# Patient Record
Sex: Female | Born: 1955 | Race: White | Hispanic: No | Marital: Married
Health system: Southern US, Community
[De-identification: ages and names within clinical notes are randomized; demographics above are authoritative.]

---

## 2006-03-16 ENCOUNTER — Ambulatory Visit: Payer: Self-pay | Admitting: Urology

## 2007-12-16 ENCOUNTER — Emergency Department: Payer: Self-pay | Admitting: Emergency Medicine

## 2008-04-27 ENCOUNTER — Emergency Department: Payer: Self-pay | Admitting: Emergency Medicine

## 2013-02-18 ENCOUNTER — Ambulatory Visit: Payer: Self-pay | Admitting: Family Medicine

## 2015-12-17 ENCOUNTER — Other Ambulatory Visit: Payer: Self-pay | Admitting: Neurology

## 2015-12-17 DIAGNOSIS — R1310 Dysphagia, unspecified: Secondary | ICD-10-CM

## 2015-12-20 ENCOUNTER — Ambulatory Visit
Admission: RE | Admit: 2015-12-20 | Discharge: 2015-12-20 | Disposition: A | Discharge status: Home / Self Care | Payer: BLUE CROSS/BLUE SHIELD | Source: Ambulatory Visit | Attending: Neurology | Admitting: Neurology

## 2015-12-20 DIAGNOSIS — R1310 Dysphagia, unspecified: Secondary | ICD-10-CM | POA: Diagnosis present

## 2015-12-20 DIAGNOSIS — K449 Diaphragmatic hernia without obstruction or gangrene: Secondary | ICD-10-CM | POA: Insufficient documentation

## 2015-12-20 DIAGNOSIS — K228 Other specified diseases of esophagus: Secondary | ICD-10-CM | POA: Insufficient documentation

## 2018-03-04 IMAGING — RF DG ESOPHAGUS
6 series · 12 of 12 positions shown · non-contrast
Comparison: None in PACs

CLINICAL DATA: Dysphagia with solids and liquids, history of
Huntington's disease.

EXAM:
ESOPHOGRAM / BARIUM SWALLOW / BARIUM TABLET STUDY
TECHNIQUE: Combined double contrast and single contrast examination performed
using effervescent crystals, thick barium liquid, and thin barium
liquid. The patient was observed with fluoroscopy swallowing a 13 mm
barium sulphate tablet.
FLUOROSCOPY TIME:  Fluoroscopy Time:  0 minutes, 48 seconds
Number of Acquired Images:  5+ 1 video sequence

[Series 1: fluoro_barium 2fps_bw · 0.18mm/px · 2 of 2 frames shown (1 of 6)]
[frame 1/2]
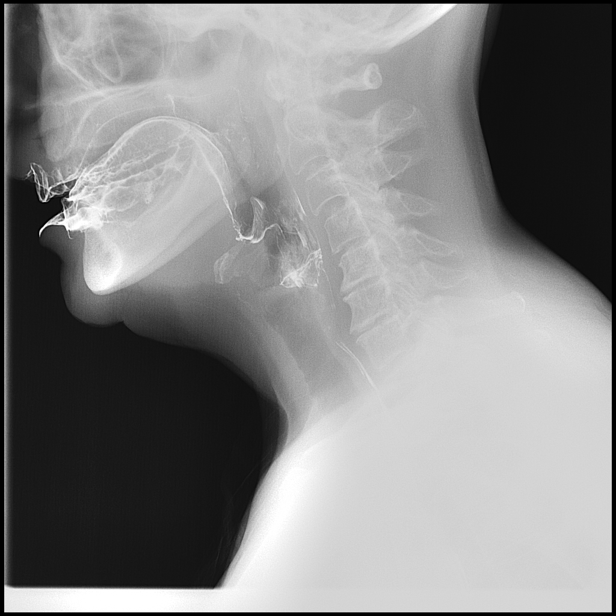
[frame 2/2]
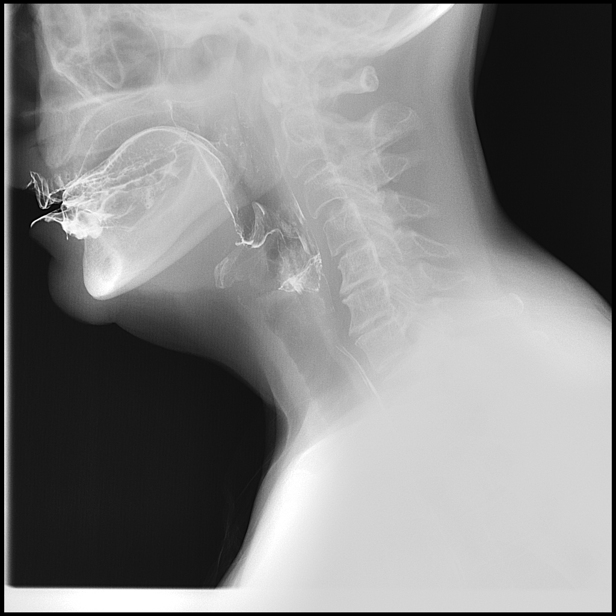

[Series 2: fluoro_barium 2fps_bw · 0.18mm/px · 2 of 2 frames shown (2 of 6)]
[frame 1/2]
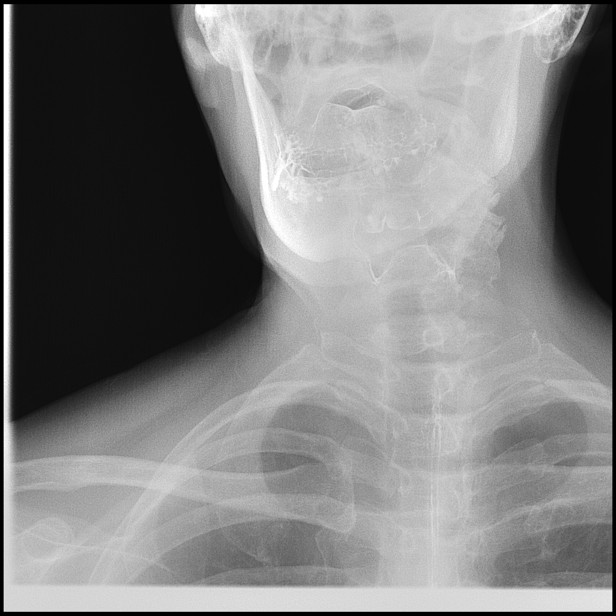
[frame 2/2]
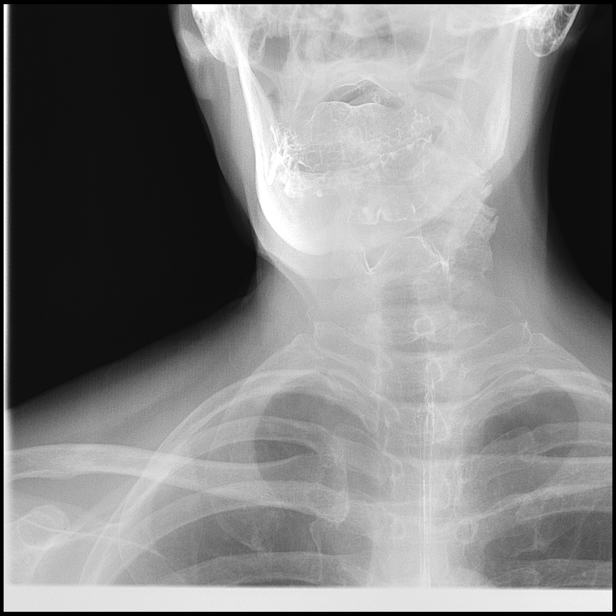

[Series 3: fluoro_barium 2fps_bw · 0.18mm/px · 1 of 1 slices shown (3 of 6)]
[im 1/1]
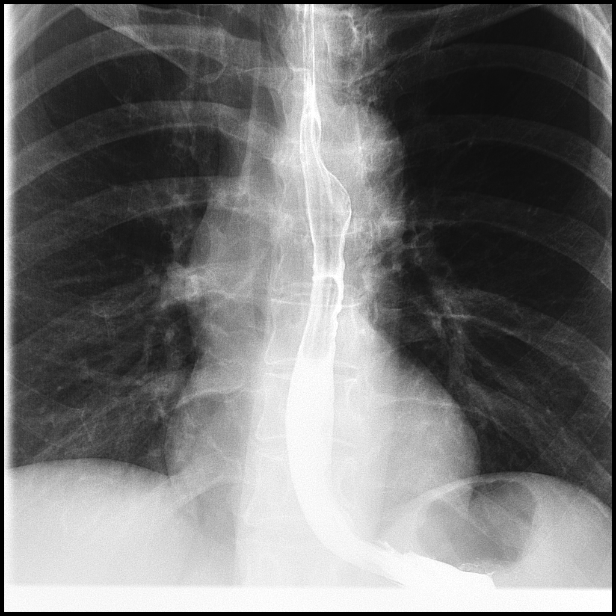

[Series 4: fluoro_barium 2fps_bw · 0.18mm/px · 1 of 1 slices shown (4 of 6)]
[im 1/1]
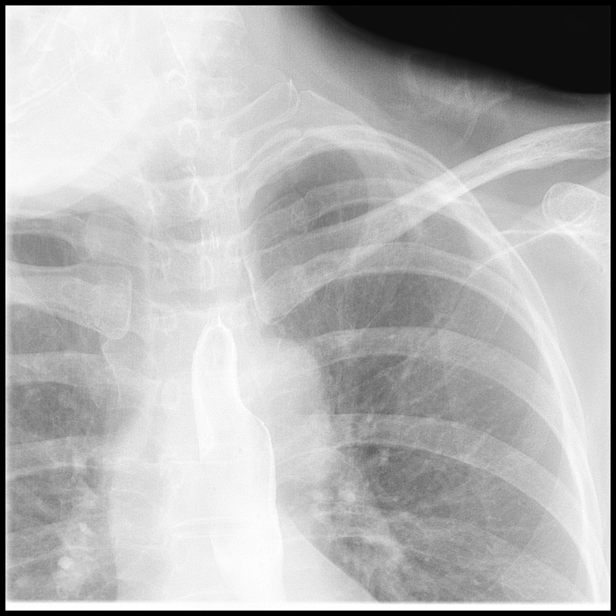

[Series 5: fluoro_barium 2fps_bw · 0.18mm/px · 4 of 18 frames shown (5 of 6)]
[frame 3/18]
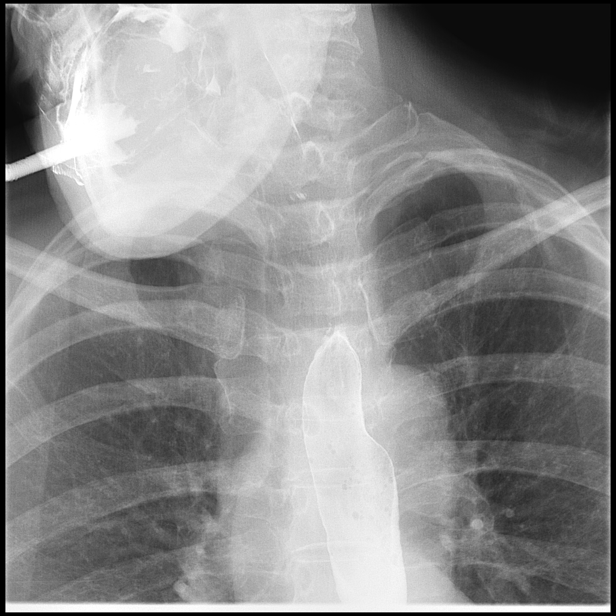
[frame 10/18]
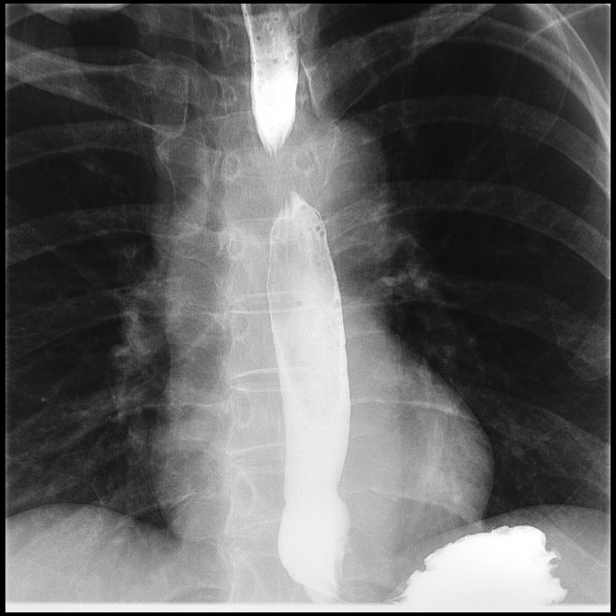
[frame 13/18]
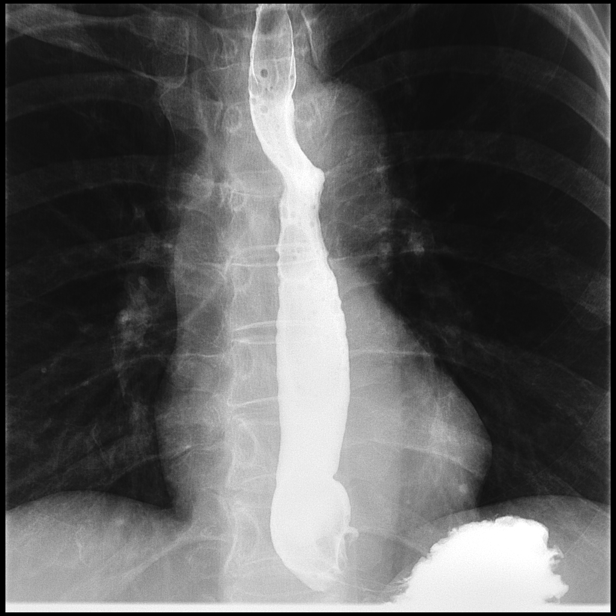
[frame 16/18]
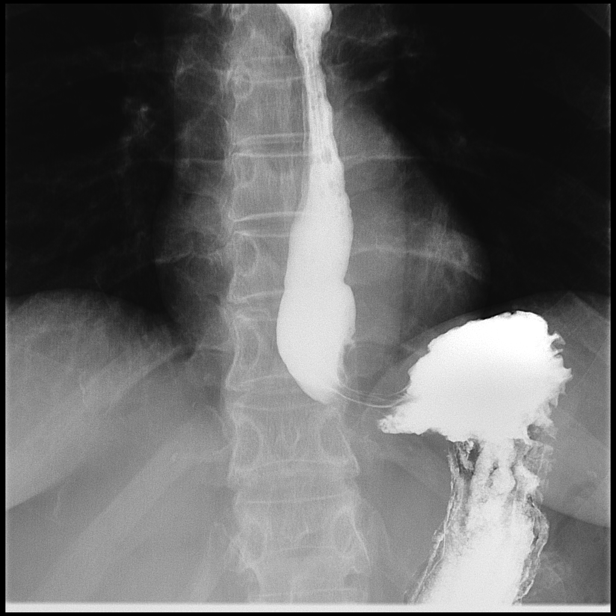

[Series 6: fluoro_barium 2fps_bw · 0.18mm/px · 2 of 2 frames shown (6 of 6)]
[frame 1/2]
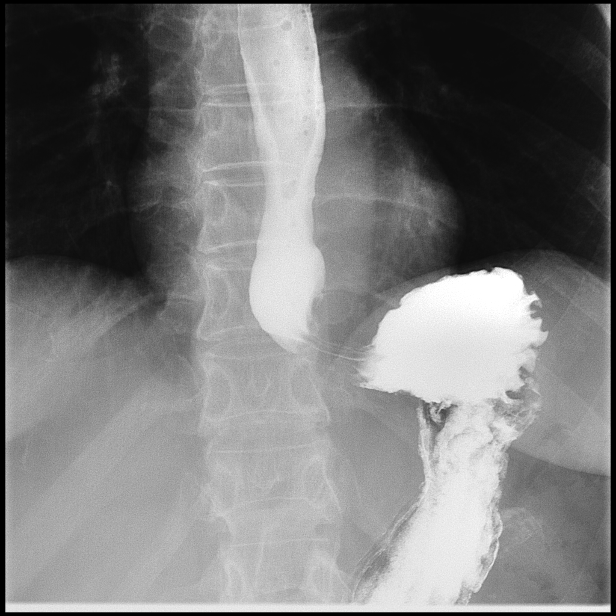
[frame 2/2]
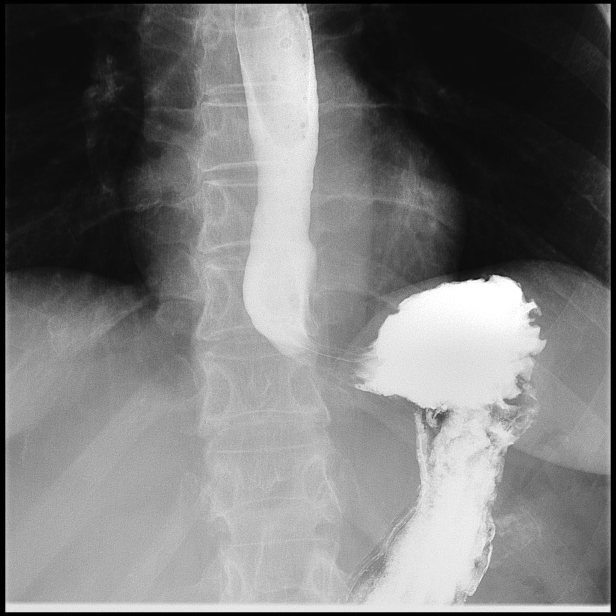

[12 of 12 positions shown; findings below may reference images not displayed]

FINDINGS: The patient ingested the thick and thin barium without difficulty.
There was mild delay in initiation of the primary swallowing
maneuver. However, once the barium was propelled into the
hypopharynx the voluntary portion of the swallowing maneuver was
normal. A few tertiary contractions were observed in the distal
esophagus. A small reducible hiatal hernia was observed. There was
no gastroesophageal reflux. No evidence of stricture or esophagitis
was observed. The barium tablet passed promptly from the mouth to
the stomach.
IMPRESSION: 1. Mild delay in initiation of the voluntary portion of the
swallowing maneuver. The autonomic portion of the swallowing
maneuver appeared normal.
2. Mild changes of presbyesophagus were observed transiently. A
small reducible hiatal hernia was observed without evidence of
reflux, stricture, or esophagitis.

## 2019-03-16 ENCOUNTER — Telehealth: Payer: Self-pay | Admitting: Primary Care

## 2019-03-16 NOTE — Telephone Encounter (Signed)
Called patient to schedule Palliative Consult and spoke with patient's sister Hoyle Sauer, after discussing what Palliative services were she was in agreement with this.  I have scheduled a Telephone Consult for 03/23/19 @ 11 AM

## 2019-03-23 ENCOUNTER — Other Ambulatory Visit: Payer: Self-pay

## 2019-03-23 ENCOUNTER — Other Ambulatory Visit: Payer: BLUE CROSS/BLUE SHIELD | Admitting: Primary Care

## 2019-03-23 DIAGNOSIS — Z515 Encounter for palliative care: Secondary | ICD-10-CM

## 2019-03-23 NOTE — Progress Notes (Signed)
[facility] Consult Note Telephone: [phone]  Fax: [phone]  PATIENT NAME: Rachael Hill [id number] [phone] (home)  DOB: 01-20-1956 MRN: [id number]  PRIMARY CARE PROVIDER: Gay[place] Hill, [place] / [place] [address]  REFERRING PROVIDER:  Anabel Bene, MD [facility],  [place] [address] [phone]  RESPONSIBLE PARTY:   Extended Emergency Contact Information Primary Emergency Contact: Hill,Rachael C Address: [address]          [place], [place] [address] [place] Phone: [phone] Relation: Spouse    I visited at home with her sister and care giver, and their mother who is also bedbound.Mr Granderson joined at the end, and I will have SW call to discuss community care options.  ASSESSMENT AND RECOMMENDATIONS:   1. Advance Care Planning/Goals of Care: Goals include to maximize quality of life and symptom management. Family states MD said nurses would come out to help her, unclear as to which  Program of nurses. We discussed their caregiving burden and their resources for additional care.  Patient states she did not take out long term care insurance. She lives with her husband who works nights at a [employer]. Patient worked for [employer], and she's now on disability. We discussed ACP, sister stated her husband would address. I left a MOST form for them to complete.   2. Symptom Management:   Pain: Recommend Acetaminophen arthritis 650 mg tid. Currently on tramodol 50 mg tid prn, and had been on one daily. Increase has helped pain. She has  had increase in gabapentin which seems to help with pain as well.   Nutrition: Eats well but reports occ. dysphagia. She has difficulty usually about once  weekly. Eats  Mechanical soft diet. States she had ST consult who showed her some maneuvers. Does not use thickener. Has well  fitting dentures. Eats 100 % of diet.  Mobility: Currently husband and Rachael Hill pick her up together, transport her to an office chair and push her to bed. We discussed a hoyer lift and tilt chair for better mobility. They were not interested at this point.  We also discussed a hospital bed for the future. I would like to revisit getting DME to assist them in caregiving on future visits.   Caregiving: Gets social security disability check but says she does not have medicare disability. I have asked  PC SW to call husband RE caregiver options in light of their financial situation and eligibility. He works nights so would like phone calls between 9 am and 11 am.  3. Family /Caregiver/Community Supports: Taken care of by sister, who also cares for their mother in the home.  [place].  4. Cognitive / Functional decline: Stopped walking 2 weeks ago. States she decided to stop walking due to falling a lot. Began having HC sx 6 years ago. Appears WNWD.  5. Follow up Palliative Care Visit: Palliative care will continue to follow for goals of care clarification and symptom management. Return 4-6 weeks or prn.  I spent 60 minutes providing this consultation,  from 1200 to 1300. More than 50% of the time in this consultation was spent coordinating communication.   HISTORY OF PRESENT ILLNESS:  Rachael Hill is a 63 y.o. year old female with multiple medical problems including Huntington's disease, HTN. Palliative Care  was asked to follow this patient by consultation request of Rachael Hill, Rachael FujitaZachary E, MD to help address advance care planning and goals of care. This is a follow up visit.  CODE STATUS: TBD  PPS: 30% HOSPICE ELIGIBILITY/DIAGNOSIS: no due to life expectancy >  6 months.  PAST MEDICAL HISTORY: No past medical history on file.  SOCIAL HX:  Social History   Tobacco Use  . Smoking status: Not on file  Substance Use Topics  . Alcohol use: Not on file     ALLERGIES: Not on File   PERTINENT MEDICATIONS:  Outpatient Encounter Medications as of 03/23/2019  Medication Sig  . gabapentin (NEURONTIN) 300 MG capsule Take 300 mg by mouth 3 (three) times daily.  . meclizine (ANTIVERT) 12.5 MG tablet Take 12.5 mg by mouth 4 (four) times daily as needed for dizziness.  . metoprolol succinate (TOPROL-XL) 50 MG 24 hr tablet Take 50 mg by mouth daily. Take with or immediately following a meal.  . traMADol (ULTRAM) 50 MG tablet Take 50 mg by mouth every 8 (eight) hours as needed.   No facility-administered encounter medications on file as of 03/23/2019.      PHYSICAL EXAM / ROS:   Current and past weights: unavailable but she is her normal size  Per report. General: NAD, frail appearing, thin. Has pain in legs tx with tramadol 50 mg tid prn Cardiovascular: no chest pain reported, no edema Pulmonary: no cough, no increased SOB, RA Abdomen: appetite good, eats 100%, denies constipation, continent of bowel with timed toileting GU: denies dysuria, continent of urine, use timed toileting  MSK:  no joint deformities, ambulatory, drop foot, feeds self but needs help with with personal care. Skin: no rashes or wounds reported Neurological: Weakness, Huntingtons Disease, able to sleep at hs.  Marijo FileKathryn M Ceejay Kegley DNP AGPCNP-BC  COVID-19 PATIENT SCREENING TOOL  Person answering questions: _____Dawn______________ _____   1.  Is the patient or any family member in the home showing any signs or symptoms regarding respiratory infection?               Person with Symptom- ________NA___________________  a. Fever                                                                          Yes___ No___          ___________________  b. Shortness of breath                                                    Yes___ No___          ___________________ c. Cough/congestion                                       Yes___  No___         ___________________ d. Body aches/pains  Yes___ No___        ____________________ Hill. Gastrointestinal symptoms (diarrhea, nausea)           Yes___ No___        ____________________  2. Within the past 14 days, has anyone living in the home had any contact with someone with or under investigation for COVID-19?    Yes___ No__X   Person __________________

## 2019-03-25 ENCOUNTER — Telehealth: Payer: Self-pay

## 2019-03-25 NOTE — Telephone Encounter (Signed)
SW received referral from [place], NP to contact patient's husband. SW attempted to contact. SW spoke with Hoyle Sauer (patient's sister). Hoyle Sauer reports that patient's husband is sleeping at this time as he works third shift. SW left contact information and Hoyle Sauer said she will have patient's husband call back on Monday.

## 2019-03-29 ENCOUNTER — Telehealth: Payer: Self-pay

## 2019-03-29 NOTE — Telephone Encounter (Signed)
SW attempted to contact Doren Custard (patient's husband). SW unable to leave a message.

## 2019-03-30 ENCOUNTER — Telehealth: Payer: Self-pay

## 2019-03-30 NOTE — Telephone Encounter (Signed)
SW attempted to contact Doren Custard (patient's husband), SW spoke with patient's sister. SW left contact information again. Patient's sister is requesting SW call back tomorrow morning.

## 2019-03-31 ENCOUNTER — Telehealth: Payer: Self-pay

## 2019-03-31 NOTE — Telephone Encounter (Signed)
SW contacted Doren Custard (patient's husband) to discuss care needs and resource information. Doren Custard provided brief overview of care. Doren Custard states "I can't do this for long, it is too much" referring to caring for his wife. Patient is dependent on care of others. Doren Custard said patient cannot feed herself. Patient is remains in the bed unless Doren Custard picks her up and puts her in the chair. Doren Custard is interested in in-home care options. Discussed income and insurance. Due to her SSDI income amount, she would not qualify for Medicaid at this time. Discussed grant funds and encouraged Doren Custard to call [facility] to discuss options. Also discussed in-home care agencies or private duty care options. Doren Custard did not have specific questions at this time but was appreciative of phone call. SW provided thorough education on service options, provided emotional support and validated caregivier role and stress. SW also notified NP of conversation.

## 2019-06-13 ENCOUNTER — Telehealth: Payer: Self-pay | Admitting: Primary Care

## 2019-06-13 NOTE — Telephone Encounter (Signed)
Attempted to call for appt.. No ability to leave a message.

## 2019-06-20 ENCOUNTER — Telehealth: Payer: Self-pay | Admitting: Primary Care

## 2019-06-20 NOTE — Telephone Encounter (Signed)
Called to schedule visit. Sister answered, Stated they no longer want any visits. I offered to be available for any future needs but will d/c for now.

## 2019-09-17 DEATH — deceased
